# Patient Record
Sex: Male | Born: 1970 | Race: White | Hispanic: No | Marital: Married | Smoking: Never smoker
Health system: Southern US, Community
[De-identification: ages and names within clinical notes are randomized; demographics above are authoritative.]

## PROBLEM LIST (undated history)

## (undated) HISTORY — PX: WISDOM TOOTH EXTRACTION: SHX21

---

## 2012-11-28 ENCOUNTER — Other Ambulatory Visit (HOSPITAL_COMMUNITY): Payer: Self-pay | Admitting: Internal Medicine

## 2012-11-28 DIAGNOSIS — R109 Unspecified abdominal pain: Secondary | ICD-10-CM

## 2012-12-01 ENCOUNTER — Ambulatory Visit (HOSPITAL_COMMUNITY)
Admission: RE | Admit: 2012-12-01 | Discharge: 2012-12-01 | Disposition: A | Discharge status: Home / Self Care | Payer: 59 | Source: Ambulatory Visit | Attending: Internal Medicine | Admitting: Internal Medicine

## 2012-12-01 ENCOUNTER — Other Ambulatory Visit (HOSPITAL_COMMUNITY): Payer: Self-pay | Admitting: Internal Medicine

## 2012-12-01 DIAGNOSIS — R109 Unspecified abdominal pain: Secondary | ICD-10-CM

## 2014-05-05 IMAGING — US US PELVIS LIMITED
1 series · 4 of 4 positions shown · non-contrast
Comparison: None.

CLINICAL DATA: Abdominal pain.  Evaluate for hernia.  Intermittent
pain inferior to the umbilicus for 3 months.  Symptoms in the
infraumbilical region and left lower quadrant.

US PELVIS LIMITED OR FOLLOW UP

[Series 1: us pelvis limited · 0.07mm/px · 4 of 4 slices shown]
[im 1/4]
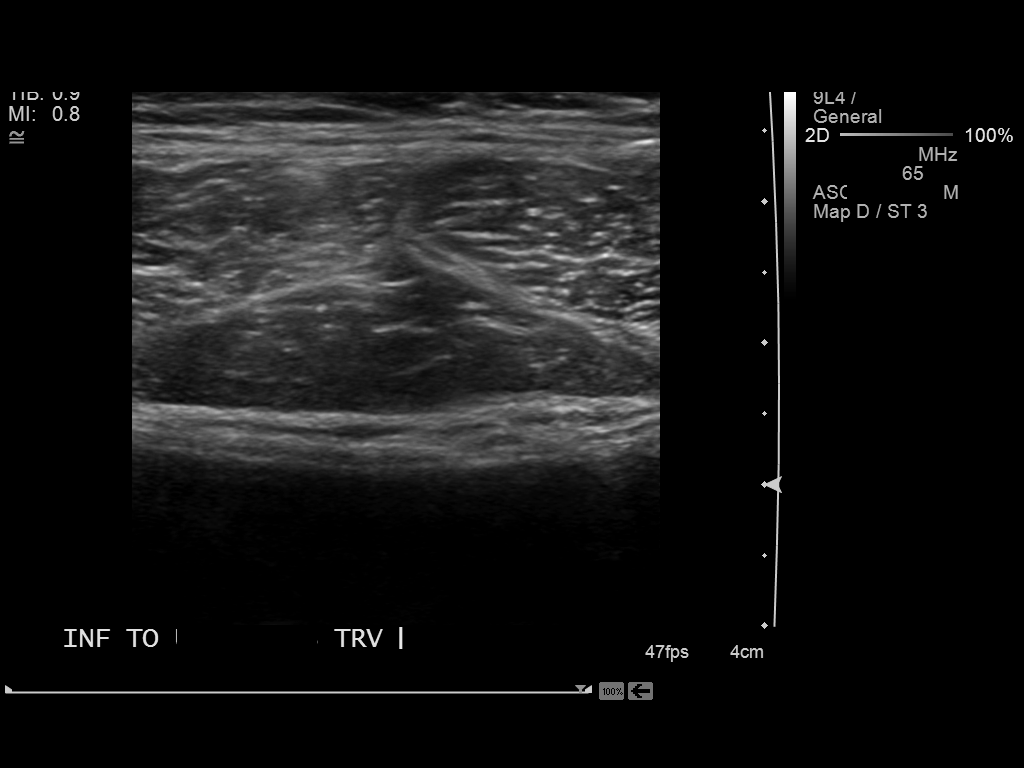
[im 2/4]
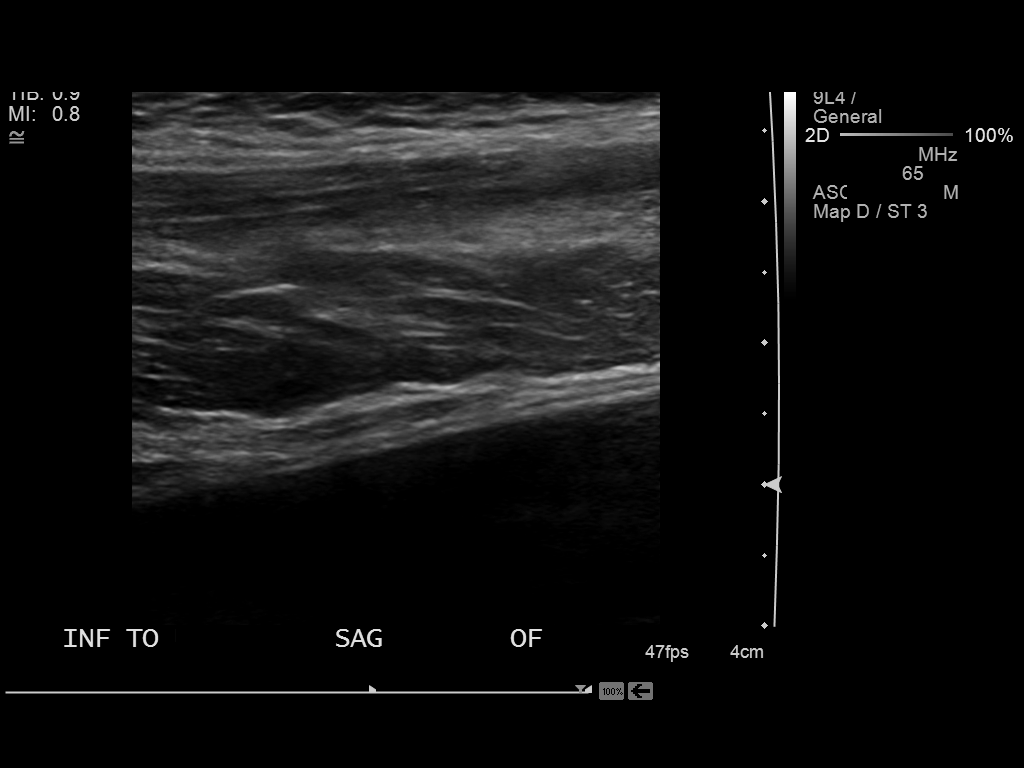
[im 3/4]
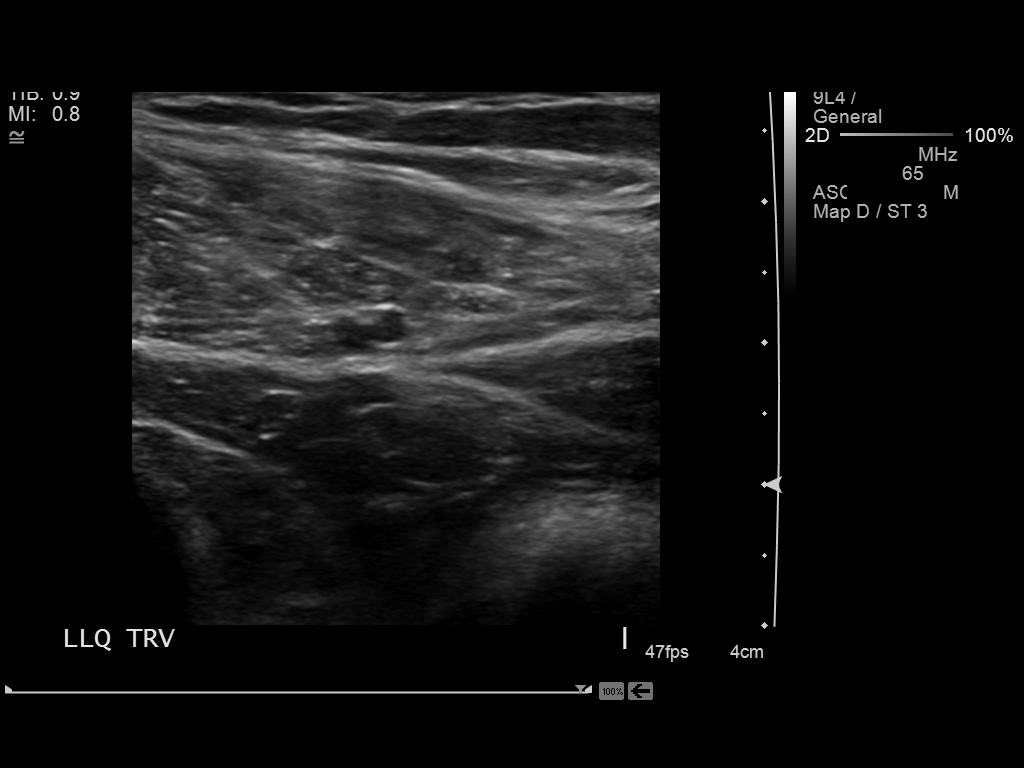
[im 4/4]
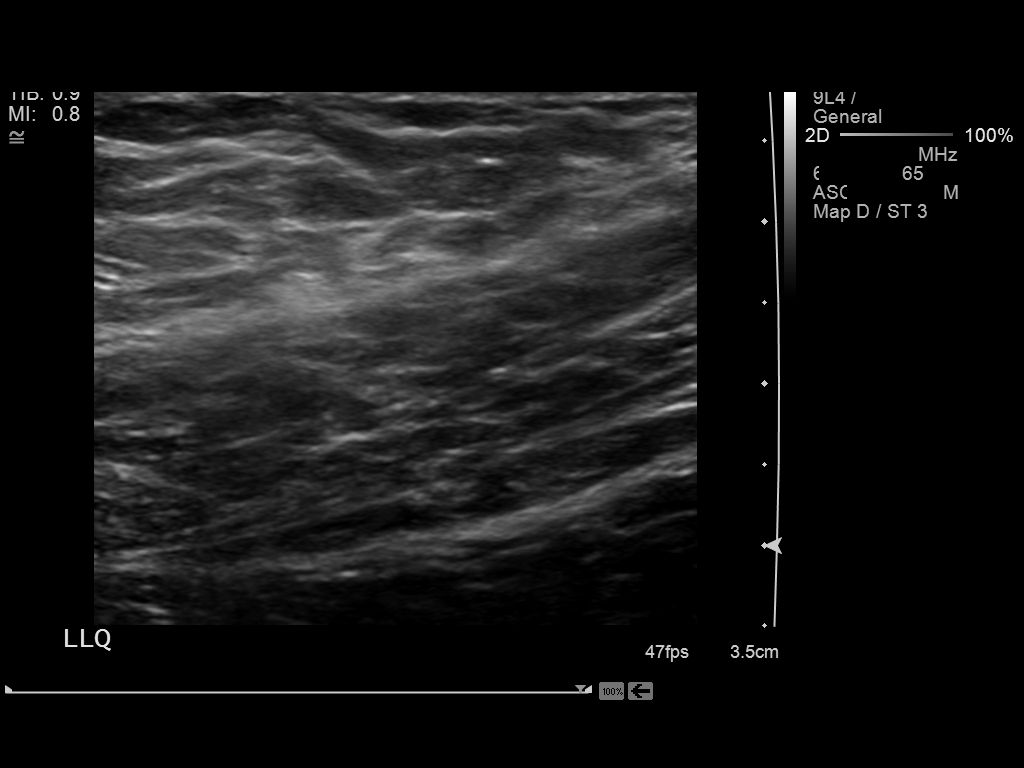

[4 of 4 positions shown; findings below may reference images not displayed]

FINDINGS: Targeted ultrasound is performed in the area of concern
as described above.  No mass or abdominal wall defects are
identified.
IMPRESSION: Negative exam.

## 2016-09-17 DIAGNOSIS — Z23 Encounter for immunization: Secondary | ICD-10-CM | POA: Diagnosis not present

## 2017-06-25 DIAGNOSIS — Z0000 Encounter for general adult medical examination without abnormal findings: Secondary | ICD-10-CM | POA: Diagnosis not present

## 2017-06-25 DIAGNOSIS — Z1389 Encounter for screening for other disorder: Secondary | ICD-10-CM | POA: Diagnosis not present

## 2017-06-25 DIAGNOSIS — Z23 Encounter for immunization: Secondary | ICD-10-CM | POA: Diagnosis not present

## 2021-07-26 ENCOUNTER — Encounter: Payer: Self-pay | Admitting: Gastroenterology

## 2021-09-12 ENCOUNTER — Ambulatory Visit (AMBULATORY_SURGERY_CENTER): Payer: 59 | Admitting: *Deleted

## 2021-09-12 ENCOUNTER — Other Ambulatory Visit: Payer: Self-pay

## 2021-09-12 VITALS — Ht 68.0 in | Wt 185.0 lb

## 2021-09-12 DIAGNOSIS — Z1211 Encounter for screening for malignant neoplasm of colon: Secondary | ICD-10-CM

## 2021-09-12 MED ORDER — NA SULFATE-K SULFATE-MG SULF 17.5-3.13-1.6 GM/177ML PO SOLN: 2.0000 | Freq: Once | ORAL | 0 refills | Status: AC

## 2021-09-12 NOTE — Progress Notes (Signed)

## 2021-09-20 ENCOUNTER — Encounter: Payer: Self-pay | Admitting: Gastroenterology

## 2021-09-26 ENCOUNTER — Other Ambulatory Visit: Payer: Self-pay

## 2021-09-26 ENCOUNTER — Encounter: Payer: Self-pay | Admitting: Gastroenterology

## 2021-09-26 ENCOUNTER — Ambulatory Visit (AMBULATORY_SURGERY_CENTER): Payer: 59 | Admitting: Gastroenterology

## 2021-09-26 VITALS — BP 117/71 | HR 48 | Temp 98.9°F | Resp 15 | Ht 70.0 in | Wt 185.0 lb

## 2021-09-26 DIAGNOSIS — Z1211 Encounter for screening for malignant neoplasm of colon: Secondary | ICD-10-CM | POA: Diagnosis not present

## 2021-09-26 DIAGNOSIS — D123 Benign neoplasm of transverse colon: Secondary | ICD-10-CM

## 2021-09-26 DIAGNOSIS — D124 Benign neoplasm of descending colon: Secondary | ICD-10-CM

## 2021-09-26 DIAGNOSIS — D122 Benign neoplasm of ascending colon: Secondary | ICD-10-CM

## 2021-09-26 MED ORDER — SODIUM CHLORIDE 0.9 % IV SOLN: 500.0000 mL | Freq: Once | INTRAVENOUS | Status: DC

## 2021-09-26 NOTE — Progress Notes (Signed)
Called to room to assist during endoscopic procedure.  Patient ID and intended procedure confirmed with present staff. Received instructions for my participation in the procedure from the performing physician.  

## 2021-09-26 NOTE — Progress Notes (Signed)
PT taken to PACU. Monitors in place. VSS. Report given to RN. 

## 2021-09-26 NOTE — Patient Instructions (Signed)
Read all of the handouts given to you by your recovery room nurse. ? ?YOU HAD AN ENDOSCOPIC PROCEDURE TODAY AT THE [facility]:   Refer to the procedure report that was given to you for any specific questions about what was found during the examination.  If the procedure report does not answer your questions, please call your gastroenterologist to clarify.  If you requested that your care partner not be given the details of your procedure findings, then the procedure report has been included in a sealed envelope for you to review at your convenience later. ? ?YOU SHOULD EXPECT: Some feelings of bloating in the abdomen. Passage of more gas than usual.  Walking can help get rid of the air that was put into your GI tract during the procedure and reduce the bloating. If you had a lower endoscopy (such as a colonoscopy or flexible sigmoidoscopy) you may notice spotting of blood in your stool or on the toilet paper. If you underwent a bowel prep for your procedure, you may not have a normal bowel movement for a few days. ? ?Please Note:  You might notice some irritation and congestion in your nose or some drainage.  This is from the oxygen used during your procedure.  There is no need for concern and it should clear up in a day or so. ? ?SYMPTOMS TO REPORT IMMEDIATELY: ? ?Following lower endoscopy (colonoscopy or flexible sigmoidoscopy): ? Excessive amounts of blood in the stool ? Significant tenderness or worsening of abdominal pains ? Swelling of the abdomen that is new, acute ? Fever of 100?F or higher ? ?  ?For urgent or emergent issues, a gastroenterologist can be reached at any hour by calling [phone]. ?Do not use MyChart messaging for urgent concerns.  ? ? ?DIET:  We do recommend a small meal at first, but then you may proceed to your regular diet.  Drink plenty of fluids but you should avoid alcoholic beverages for 24 hours. ? ?ACTIVITY:  You should plan to take it easy for the rest of today  and you should NOT DRIVE or use heavy machinery until tomorrow (because of the sedation medicines used during the test).   ? ?FOLLOW UP: ?Our staff will call the number listed on your records 48-72 hours following your procedure to check on you and address any questions or concerns that you may have regarding the information given to you following your procedure. If we do not reach you, we will leave a message.  We will attempt to reach you two times.  During this call, we will ask if you have developed any symptoms of COVID 19. If you develop any symptoms (ie: fever, flu-like symptoms, shortness of breath, cough etc.) before then, please call [phone].  If you test positive for Covid 19 in the 2 weeks post procedure, please call and report this information to us.   ? ?If any biopsies were taken you will be contacted by phone or by letter within the next 1-3 weeks.  Please call us at [phone] if you have not heard about the biopsies in 3 weeks.  ? ? ?SIGNATURES/CONFIDENTIALITY: ?You and/or your care partner have signed paperwork which will be entered into your electronic medical record.  These signatures attest to the fact that that the information above on your After Visit Summary has been reviewed and is understood.  Full responsibility of the confidentiality of this discharge information lies with you and/or your care-partner.  ?

## 2021-09-26 NOTE — Op Note (Signed)
Don Shah: Don Shah Procedure Date: 09/26/2021 8:25 AM MRN: [id number] Endoscopist: Nicki Reaper E. Candis Schatz , MD Age: 51 Referring MD:  Date of Birth: 01-07-1971 Gender: Male Account #: [id number] Procedure:                Colonoscopy Indications:              Screening for colorectal malignant neoplasm, This                            is the patient's first colonoscopy Medicines:                Monitored Anesthesia Care Procedure:                Pre-Anesthesia Assessment:                           - Prior to the procedure, a History and Physical                            was performed, and patient medications and                            allergies were reviewed. The patient's tolerance of                            previous anesthesia was also reviewed. The risks                            and benefits of the procedure and the sedation                            options and risks were discussed with the patient.                            All questions were answered, and informed consent                            was obtained. Prior Anticoagulants: The patient has                            taken no previous anticoagulant or antiplatelet                            agents. ASA Grade Assessment: I - A normal, healthy                            patient. After reviewing the risks and benefits,                            the patient was deemed in satisfactory condition to                            undergo the procedure.  After obtaining informed consent, the colonoscope                            was passed under direct vision. Throughout the                            procedure, the patient's blood pressure, pulse, and                            oxygen saturations were monitored continuously. The                            CF [id number] #[id number] was introduced through the anus                            and advanced to the the  terminal ileum, with                            identification of the appendiceal orifice and IC                            valve. The colonoscopy was performed without                            difficulty. The patient tolerated the procedure                            well. The quality of the bowel preparation was                            adequate. The terminal ileum, ileocecal valve,                            appendiceal orifice, and rectum were photographed.                            The bowel preparation used was SUPREP via split                            dose instruction. Scope In: 8:41:00 AM Scope Out: 9:06:06 AM Scope Withdrawal Time: 0 hours 17 minutes 30 seconds  Total Procedure Duration: 0 hours 25 minutes 6 seconds  Findings:                 The perianal and digital rectal examinations were                            normal. Pertinent negatives include normal                            sphincter tone and no palpable rectal lesions.                           Two flat polyps were found in the ascending colon.  The polyps were 4 to 6 mm in size. These polyps                            were removed with a cold snare. Resection and                            retrieval were complete. Estimated blood loss was                            minimal.                           Two sessile polyps were found in the splenic                            flexure. The polyps were 3 to 10 mm in size. These                            polyps were removed with a cold snare. Resection                            and retrieval were complete. Estimated blood loss                            was minimal.                           A 2 mm polyp was found in the descending colon. The                            polyp was sessile. The polyp was removed with a                            cold snare. Resection and retrieval were complete.                            Estimated blood  loss was minimal.                           The exam was otherwise normal throughout the                            examined colon.                           The terminal ileum appeared normal.                           The retroflexed view of the distal rectum and anal                            verge was normal and showed no anal or rectal  abnormalities. Complications:            No immediate complications. Estimated Blood Loss:     Estimated blood loss was minimal. Impression:               - Two 4 to 6 mm polyps in the ascending colon,                            removed with a cold snare. Resected and retrieved.                           - Two 3 to 10 mm polyps at the splenic flexure,                            removed with a cold snare. Resected and retrieved.                           - One 2 mm polyp in the descending colon, removed                            with a cold snare. Resected and retrieved.                           - The examined portion of the ileum was normal.                           - The distal rectum and anal verge are normal on                            retroflexion view. Recommendation:           - Patient has a contact number available for                            emergencies. The signs and symptoms of potential                            delayed complications were discussed with the                            patient. Return to normal activities tomorrow.                            Written discharge instructions were provided to the                            patient.                           - Resume previous diet.                           - Continue present medications.                           - Await  pathology results.                           - Repeat colonoscopy (date not yet determined) for                            surveillance based on pathology results. Kloi Brodman E. Candis Schatz, MD 09/26/2021 9:13:20 AM This report  has been signed electronically.

## 2021-09-26 NOTE — Progress Notes (Signed)
[facility] History and Physical   Primary Care Physician:  Donnamae Jude, PA-C (Inactive)   Reason for Procedure:   Colon cancer screening  Plan:    Screening colonoscopy     HPI: Don Shah is a 51 y.o. male undergoing initial average risk screening colonoscopy.  He has no family history of colon cancer and no chronic GI symptoms.    History reviewed. No pertinent past medical history.  Past Surgical History:  Procedure Laterality Date   WISDOM TOOTH EXTRACTION     no sedation    Prior to Admission medications   Medication Sig Start Date End Date Taking? Authorizing Provider  Glucosamine HCl (GLUCOSAMINE PO) Take by mouth.   Yes [provider]  Multiple Vitamins-Minerals (ZINC PO) Take by mouth.   Yes [provider]  VITAMIN D PO Take by mouth.   Yes [provider]    Current Outpatient Medications  Medication Sig Dispense Refill   Glucosamine HCl (GLUCOSAMINE PO) Take by mouth.     Multiple Vitamins-Minerals (ZINC PO) Take by mouth.     VITAMIN D PO Take by mouth.     Current Facility-Administered Medications  Medication Dose Route Frequency Provider Last Rate Last Admin   0.9 %  sodium chloride infusion  500 mL Intravenous Once Daryel November, MD        Allergies as of 09/26/2021 - Review Complete 09/26/2021  Allergen Reaction Noted   Penicillins Other (See Comments) 09/12/2021    Family History  Problem Relation Age of Onset   Colon polyps Neg Hx    Colon cancer Neg Hx    Esophageal cancer Neg Hx    Stomach cancer Neg Hx    Rectal cancer Neg Hx     Social History   Socioeconomic History   Marital status: Married    Spouse name: Not on file   Number of children: Not on file   Years of education: Not on file   Highest education level: Not on file  Occupational History   Not on file  Tobacco Use   Smoking status: Never   Smokeless tobacco: Never  Vaping Use   Vaping Use: Never used   Substance and Sexual Activity   Alcohol use: Never   Drug use: Not Currently   Sexual activity: Not on file  Other Topics Concern   Not on file  Social History Narrative   Not on file   Social Determinants of Health   Financial Resource Strain: Not on file  Food Insecurity: Not on file  Transportation Needs: Not on file  Physical Activity: Not on file  Stress: Not on file  Social Connections: Not on file  Intimate Partner Violence: Not on file    Review of Systems:  All other review of systems negative except as mentioned in the HPI.  Physical Exam: Vital signs BP 134/79    Pulse (!) 55    Temp 98.9 F (37.2 C) (Temporal)    Ht 5\' 10"  (1.778 m)    Wt 185 lb (83.9 kg)    SpO2 99%    BMI 26.54 kg/m   General:   Alert,  Well-developed, well-nourished, pleasant and cooperative in NAD Airway:  Mallampati 2 Lungs:  Clear throughout to auscultation.   Heart:  Regular rate and rhythm; no murmurs, clicks, rubs,  or gallops. Abdomen:  Soft, nontender and nondistended. Normal bowel sounds.   Neuro/Psych:  Normal mood and affect. A and O x 3  Dmarcus Decicco E. Candis Schatz, MD [facility]

## 2021-09-26 NOTE — Progress Notes (Signed)
Pt's states no medical or surgical changes since previsit or office visit.  VS CW  

## 2021-09-28 ENCOUNTER — Telehealth: Payer: Self-pay

## 2021-09-28 NOTE — Telephone Encounter (Signed)
°  Follow up Call-  Call back number 09/26/2021  Post procedure Call Back phone  # [phone]  Permission to leave phone message Yes  Some recent data might be hidden     Patient questions:  Do you have a fever, pain , or abdominal swelling? No. Pain Score  0 *  Have you tolerated food without any problems? Yes.    Have you been able to return to your normal activities? Yes.    Do you have any questions about your discharge instructions: Diet   No. Medications  No. Follow up visit  No.  Do you have questions or concerns about your Care? No.  Actions: * If pain score is 4 or above: No action needed, pain <4.  Have you developed a fever since your procedure? no  2.   Have you had an respiratory symptoms (SOB or cough) since your procedure? no  3.   Have you tested positive for COVID 19 since your procedure no  4.   Have you had any family members/close contacts diagnosed with the COVID 19 since your procedure?  no   If yes to any of these questions please route to Joylene John, RN and Joella Prince, RN

## 2021-10-05 NOTE — Progress Notes (Signed)
Mr. Nemes, All five of the polyps that I removed during your recent procedure were completely benign but were proven to be "pre-cancerous" polyps that MAY have grown into cancers if they had not been removed.  Studies shows that at least 20% of women over age 51 and 30% of men over age 25 have pre-cancerous polyps.  Based on current nationally recognized surveillance guidelines, I recommend that you have a repeat colonoscopy in 3 years.   If you develop any new rectal bleeding, abdominal pain or significant bowel habit changes, please contact me before then.

## 2023-05-08 ENCOUNTER — Other Ambulatory Visit (HOSPITAL_COMMUNITY): Payer: Self-pay | Admitting: Family Medicine

## 2023-05-08 DIAGNOSIS — Z0001 Encounter for general adult medical examination with abnormal findings: Secondary | ICD-10-CM

## 2023-05-10 ENCOUNTER — Ambulatory Visit (HOSPITAL_COMMUNITY)
Admission: RE | Admit: 2023-05-10 | Discharge: 2023-05-10 | Disposition: A | Discharge status: Home / Self Care | Payer: 59 | Source: Ambulatory Visit | Attending: Family Medicine | Admitting: Family Medicine

## 2023-05-10 DIAGNOSIS — Z0001 Encounter for general adult medical examination with abnormal findings: Secondary | ICD-10-CM | POA: Insufficient documentation
